# Patient Record
Sex: Female | Born: 2000 | Race: Black or African American | Hispanic: No | Marital: Single | Smoking: Never smoker
Health system: Southern US, Community
[De-identification: ages and names within clinical notes are randomized; demographics above are authoritative.]

## PROBLEM LIST (undated history)

## (undated) DIAGNOSIS — F419 Anxiety disorder, unspecified: Secondary | ICD-10-CM

## (undated) DIAGNOSIS — G40909 Epilepsy, unspecified, not intractable, without status epilepticus: Secondary | ICD-10-CM

---
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
## 2021-02-04 IMAGING — US US PELVIS COMPLETE TRANSABD/TRANSVAG W DUPLEX
1 series · 13 of 25 positions shown · non-contrast
Comparison: No prior.

CLINICAL DATA: Pelvic pain. Irregular cycles. Recent IUD insertion.

EXAM:
TRANSABDOMINAL AND TRANSVAGINAL ULTRASOUND OF PELVIS
DOPPLER ULTRASOUND OF OVARIES
TECHNIQUE: Both transabdominal and transvaginal ultrasound examinations of the
pelvis were performed. Transabdominal technique was performed for
global imaging of the pelvis including uterus, ovaries, adnexal
regions, and pelvic cul-de-sac.
It was necessary to proceed with endovaginal exam following the
transabdominal exam to visualize the uterus and ovaries. Color and
duplex Doppler ultrasound was utilized to evaluate blood flow to the
ovaries.

[Series 1: us pelvic complete w transvaginal and torsion righ · 13 of 73 slices shown]
[im 1/73]
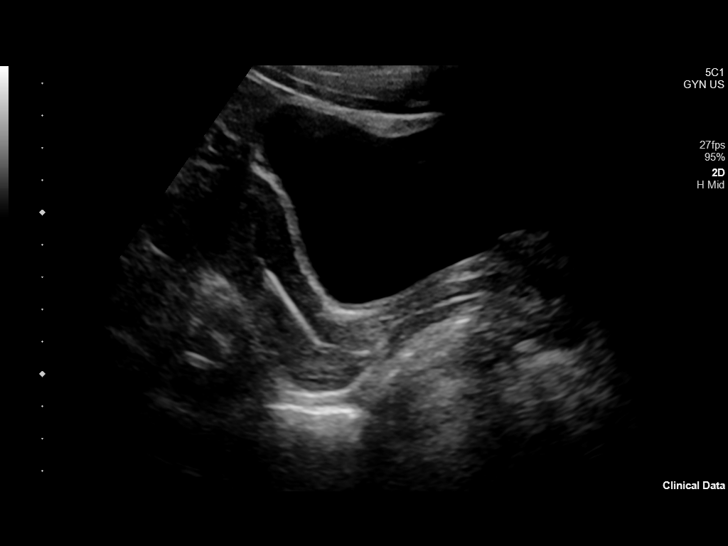
[im 7/73]
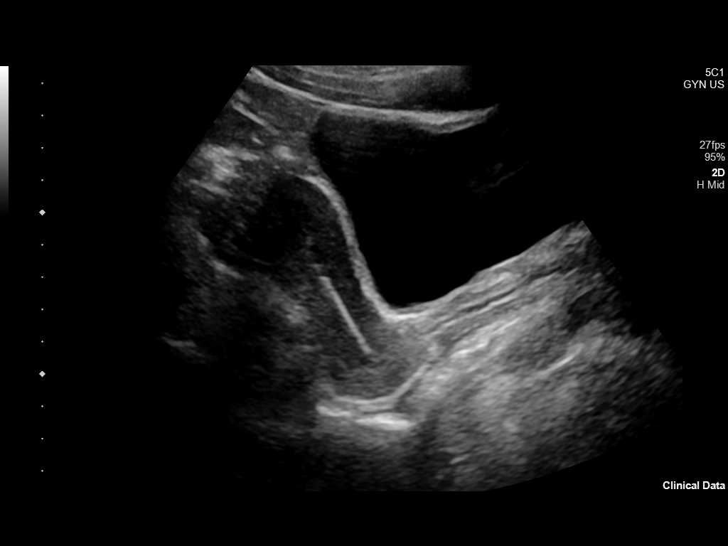
[im 13/73]
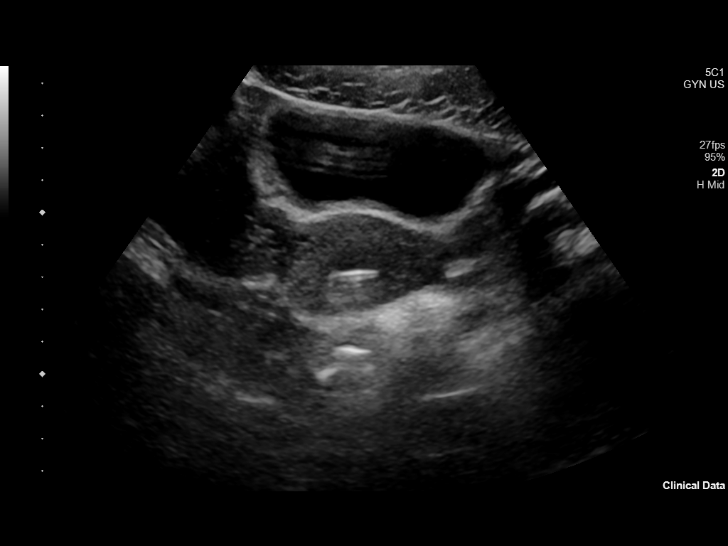
[im 19/73]
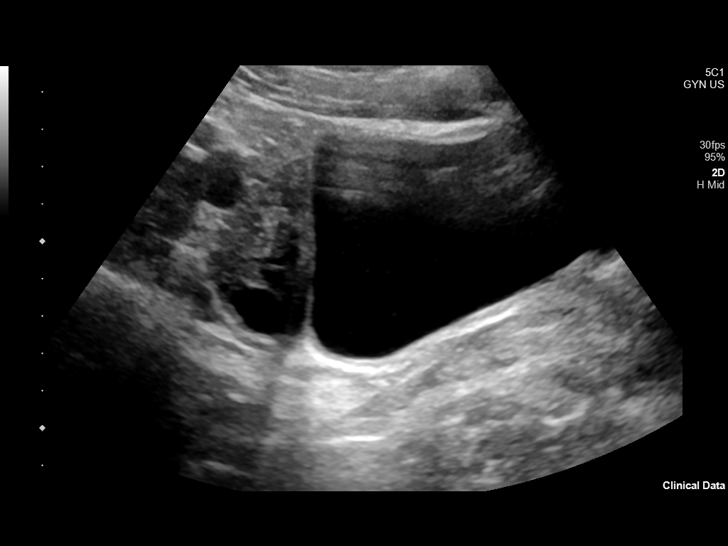
[im 25/73]
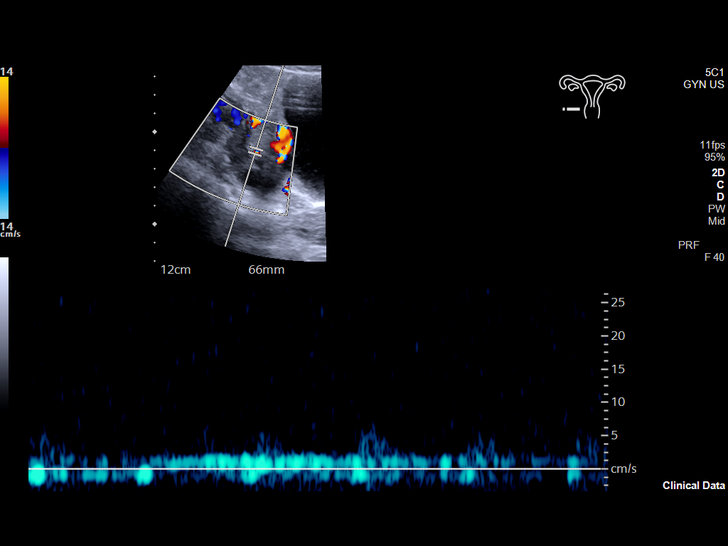
[im 31/73]
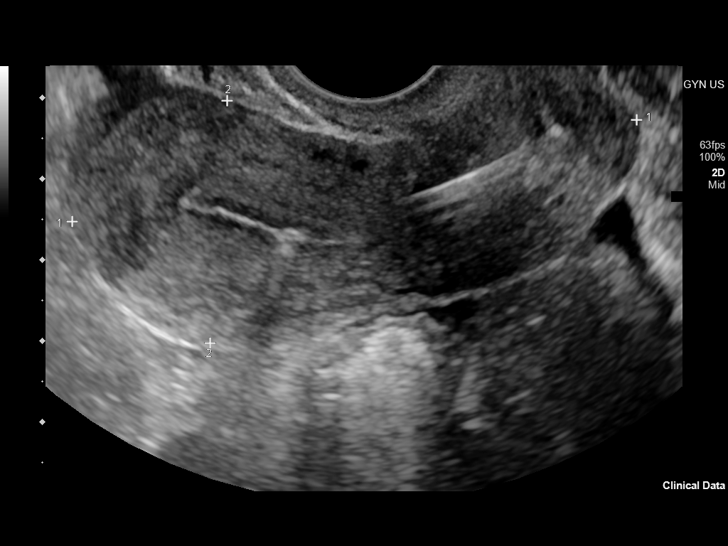
[im 37/73]
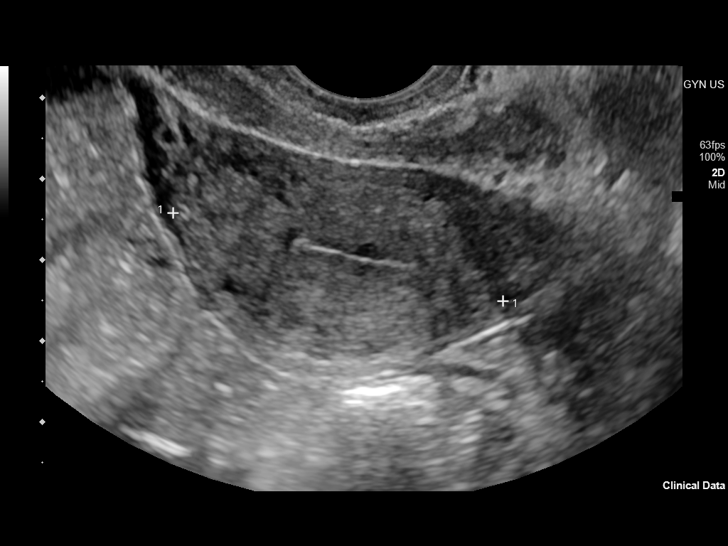
[im 43/73]
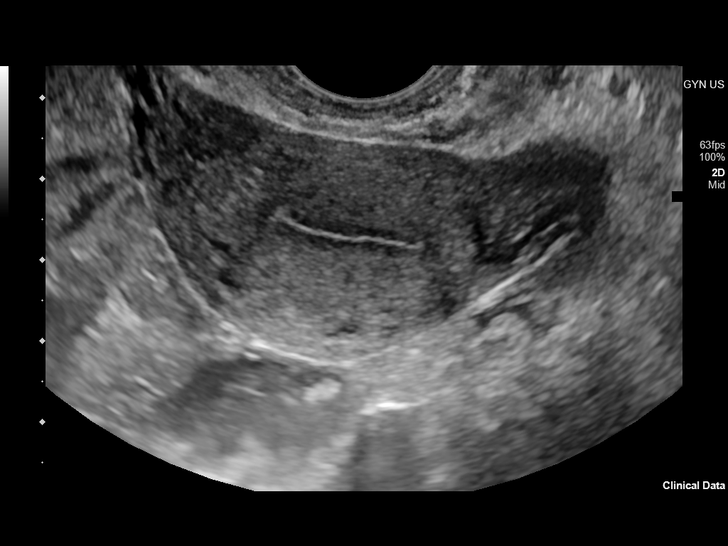
[im 49/73]
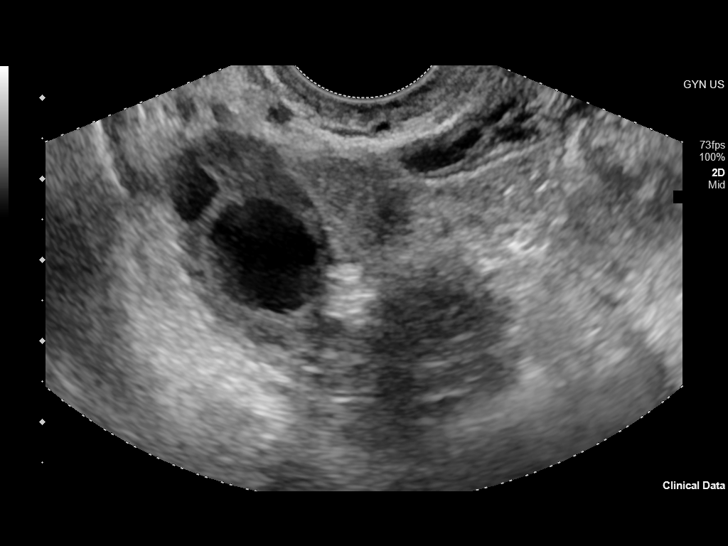
[im 55/73]
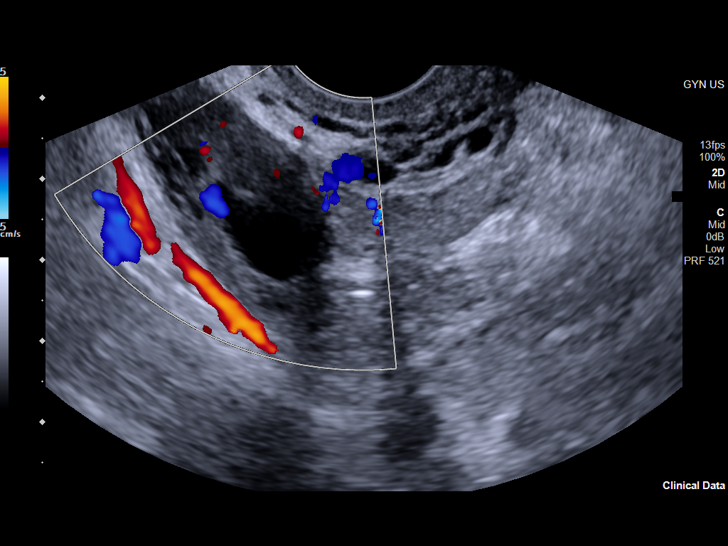
[im 61/73]
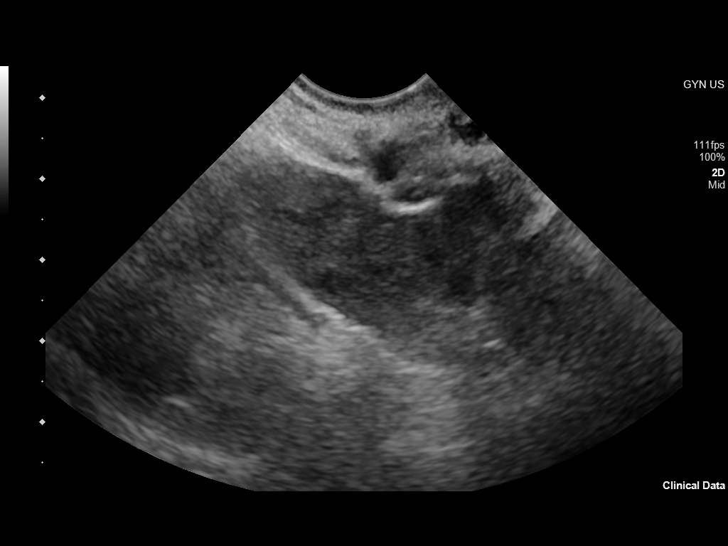
[im 67/73]
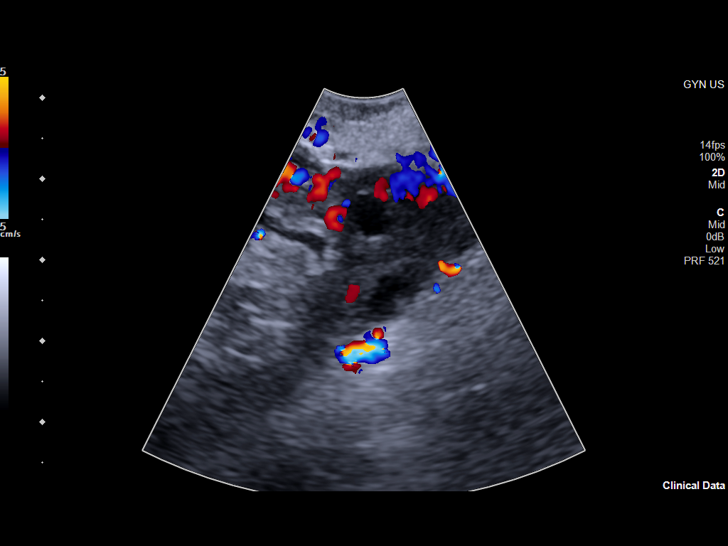
[im 73/73]
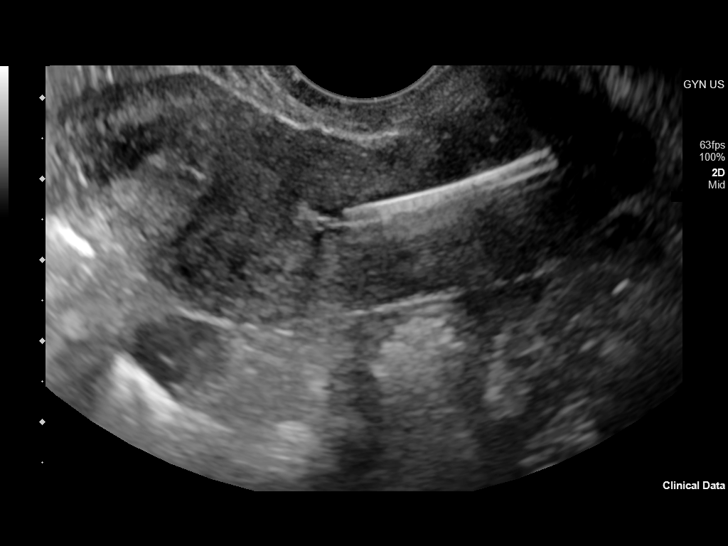

[13 of 25 positions shown; findings below may reference images not displayed]

FINDINGS: Uterus

Measurements: 7.1 x 3.0 x 4.2 cm = volume: 46.8 mL. No fibroids or
other mass visualized.

Endometrium

Thickness: 3.3 mm. No focal abnormality visualized. IUD noted in
place in the endometrial cavity.

Right ovary

Measurements: 3.4 x 2.0 x 1.9 cm = volume: 6.6 mL. Small cysts, most
likely physiologic.

Left ovary

Measurements: 3.2 x 2.5 x 1.3 cm = volume: 2.4 mL. Small cyst, most
likely physiologic.

Pulsed Doppler evaluation of both ovaries demonstrates normal
low-resistance arterial and venous waveforms.

Other findings

No abnormal free fluid.
IMPRESSION: No acute or focal abnormalities. IUD noted in place in the
endometrial cavity.

## 2023-10-14 ENCOUNTER — Ambulatory Visit: Payer: Self-pay | Admitting: Medical

## 2023-10-24 ENCOUNTER — Ambulatory Visit: Payer: Self-pay | Admitting: Medical
